# Patient Record
Sex: Female | Born: 2000 | Race: White | Hispanic: No | Marital: Single | Smoking: Never smoker
Health system: Southern US, Community
[De-identification: ages and names within clinical notes are randomized; demographics above are authoritative.]

---
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
## 2022-03-20 DIAGNOSIS — Z23 Encounter for immunization: Secondary | ICD-10-CM | POA: Diagnosis not present

## 2022-04-08 DIAGNOSIS — R21 Rash and other nonspecific skin eruption: Secondary | ICD-10-CM | POA: Diagnosis not present

## 2022-04-17 DIAGNOSIS — B309 Viral conjunctivitis, unspecified: Secondary | ICD-10-CM | POA: Diagnosis not present

## 2022-04-21 DIAGNOSIS — R509 Fever, unspecified: Secondary | ICD-10-CM | POA: Diagnosis not present

## 2022-06-08 DIAGNOSIS — Z79899 Other long term (current) drug therapy: Secondary | ICD-10-CM | POA: Diagnosis not present

## 2022-06-08 DIAGNOSIS — R5383 Other fatigue: Secondary | ICD-10-CM | POA: Diagnosis not present

## 2022-06-08 DIAGNOSIS — Z862 Personal history of diseases of the blood and blood-forming organs and certain disorders involving the immune mechanism: Secondary | ICD-10-CM | POA: Diagnosis not present

## 2022-06-08 DIAGNOSIS — N911 Secondary amenorrhea: Secondary | ICD-10-CM | POA: Diagnosis not present

## 2022-06-08 DIAGNOSIS — Z8639 Personal history of other endocrine, nutritional and metabolic disease: Secondary | ICD-10-CM | POA: Diagnosis not present

## 2022-08-15 DIAGNOSIS — Z0000 Encounter for general adult medical examination without abnormal findings: Secondary | ICD-10-CM | POA: Diagnosis not present

## 2022-08-15 DIAGNOSIS — Z68.41 Body mass index (BMI) pediatric, 5th percentile to less than 85th percentile for age: Secondary | ICD-10-CM | POA: Diagnosis not present

## 2022-08-15 DIAGNOSIS — Z7182 Exercise counseling: Secondary | ICD-10-CM | POA: Diagnosis not present

## 2022-08-15 DIAGNOSIS — Z713 Dietary counseling and surveillance: Secondary | ICD-10-CM | POA: Diagnosis not present

## 2022-08-15 DIAGNOSIS — Z1331 Encounter for screening for depression: Secondary | ICD-10-CM | POA: Diagnosis not present

## 2022-09-04 DIAGNOSIS — R5383 Other fatigue: Secondary | ICD-10-CM | POA: Diagnosis not present

## 2022-09-04 DIAGNOSIS — Z862 Personal history of diseases of the blood and blood-forming organs and certain disorders involving the immune mechanism: Secondary | ICD-10-CM | POA: Diagnosis not present

## 2022-09-27 DIAGNOSIS — I471 Supraventricular tachycardia, unspecified: Secondary | ICD-10-CM | POA: Diagnosis not present

## 2022-09-27 DIAGNOSIS — R634 Abnormal weight loss: Secondary | ICD-10-CM | POA: Diagnosis not present

## 2022-09-27 DIAGNOSIS — Z8679 Personal history of other diseases of the circulatory system: Secondary | ICD-10-CM | POA: Diagnosis not present

## 2022-09-27 DIAGNOSIS — R5381 Other malaise: Secondary | ICD-10-CM | POA: Diagnosis not present

## 2022-09-27 DIAGNOSIS — Z9889 Other specified postprocedural states: Secondary | ICD-10-CM | POA: Diagnosis not present

## 2022-09-27 DIAGNOSIS — R001 Bradycardia, unspecified: Secondary | ICD-10-CM | POA: Diagnosis not present

## 2022-09-27 DIAGNOSIS — R5383 Other fatigue: Secondary | ICD-10-CM | POA: Diagnosis not present

## 2022-10-02 DIAGNOSIS — N912 Amenorrhea, unspecified: Secondary | ICD-10-CM | POA: Diagnosis not present

## 2023-03-29 DIAGNOSIS — L819 Disorder of pigmentation, unspecified: Secondary | ICD-10-CM | POA: Diagnosis not present

## 2023-03-29 DIAGNOSIS — D2261 Melanocytic nevi of right upper limb, including shoulder: Secondary | ICD-10-CM | POA: Diagnosis not present

## 2023-03-29 DIAGNOSIS — D2271 Melanocytic nevi of right lower limb, including hip: Secondary | ICD-10-CM | POA: Diagnosis not present

## 2023-03-29 DIAGNOSIS — L814 Other melanin hyperpigmentation: Secondary | ICD-10-CM | POA: Diagnosis not present

## 2023-04-03 DIAGNOSIS — H04123 Dry eye syndrome of bilateral lacrimal glands: Secondary | ICD-10-CM | POA: Diagnosis not present

## 2023-05-01 DIAGNOSIS — N911 Secondary amenorrhea: Secondary | ICD-10-CM | POA: Diagnosis not present

## 2023-05-01 DIAGNOSIS — E230 Hypopituitarism: Secondary | ICD-10-CM | POA: Diagnosis not present

## 2023-05-01 DIAGNOSIS — R947 Abnormal results of other endocrine function studies: Secondary | ICD-10-CM | POA: Diagnosis not present

## 2023-06-06 DIAGNOSIS — N911 Secondary amenorrhea: Secondary | ICD-10-CM | POA: Diagnosis not present

## 2023-06-06 DIAGNOSIS — N912 Amenorrhea, unspecified: Secondary | ICD-10-CM | POA: Diagnosis not present

## 2023-06-06 DIAGNOSIS — E230 Hypopituitarism: Secondary | ICD-10-CM | POA: Diagnosis not present

## 2023-07-03 DIAGNOSIS — N911 Secondary amenorrhea: Secondary | ICD-10-CM | POA: Diagnosis not present

## 2023-07-03 DIAGNOSIS — E230 Hypopituitarism: Secondary | ICD-10-CM | POA: Diagnosis not present

## 2023-09-05 DIAGNOSIS — R891 Abnormal level of hormones in specimens from other organs, systems and tissues: Secondary | ICD-10-CM | POA: Diagnosis not present

## 2023-10-09 DIAGNOSIS — E230 Hypopituitarism: Secondary | ICD-10-CM | POA: Diagnosis not present

## 2023-10-09 DIAGNOSIS — N911 Secondary amenorrhea: Secondary | ICD-10-CM | POA: Diagnosis not present

## 2023-10-16 DIAGNOSIS — Z1331 Encounter for screening for depression: Secondary | ICD-10-CM | POA: Diagnosis not present

## 2023-10-16 DIAGNOSIS — N911 Secondary amenorrhea: Secondary | ICD-10-CM | POA: Diagnosis not present
